# Patient Record
Sex: Female | Born: 1970 | Race: White | Hispanic: No | Marital: Single | Smoking: Former smoker
Health system: Southern US, Community
[De-identification: ages and names within clinical notes are randomized; demographics above are authoritative.]

## PROBLEM LIST (undated history)

## (undated) HISTORY — PX: ROTATOR CUFF REPAIR: SHX139

## (undated) HISTORY — PX: ABDOMINAL SURGERY: SHX537

## (undated) HISTORY — PX: TUMOR REMOVAL: SHX12

---

## 2018-02-02 ENCOUNTER — Encounter (HOSPITAL_COMMUNITY): Payer: Self-pay | Admitting: Emergency Medicine

## 2018-02-02 ENCOUNTER — Emergency Department (HOSPITAL_COMMUNITY)
Admission: EM | Admit: 2018-02-02 | Discharge: 2018-02-03 | Disposition: A | Discharge status: Home / Self Care | Payer: BC Managed Care – PPO | Attending: Emergency Medicine | Admitting: Emergency Medicine

## 2018-02-02 ENCOUNTER — Other Ambulatory Visit: Payer: Self-pay

## 2018-02-02 DIAGNOSIS — R51 Headache: Secondary | ICD-10-CM | POA: Insufficient documentation

## 2018-02-02 DIAGNOSIS — R42 Dizziness and giddiness: Secondary | ICD-10-CM | POA: Diagnosis not present

## 2018-02-02 DIAGNOSIS — H5500 Unspecified nystagmus: Secondary | ICD-10-CM | POA: Insufficient documentation

## 2018-02-02 DIAGNOSIS — Z87891 Personal history of nicotine dependence: Secondary | ICD-10-CM | POA: Insufficient documentation

## 2018-02-02 DIAGNOSIS — R197 Diarrhea, unspecified: Secondary | ICD-10-CM | POA: Insufficient documentation

## 2018-02-02 DIAGNOSIS — R112 Nausea with vomiting, unspecified: Secondary | ICD-10-CM | POA: Diagnosis not present

## 2018-02-02 LAB — URINALYSIS, ROUTINE W REFLEX MICROSCOPIC
Bilirubin Urine: NEGATIVE
Glucose, UA: NEGATIVE mg/dL
Ketones, ur: NEGATIVE mg/dL
Leukocytes, UA: NEGATIVE
Nitrite: NEGATIVE
Protein, ur: NEGATIVE mg/dL
Specific Gravity, Urine: 1.023 (ref 1.005–1.030)
pH: 5 (ref 5.0–8.0)

## 2018-02-02 LAB — CBC
HCT: 42.8 % (ref 36.0–46.0)
HEMOGLOBIN: 14.9 g/dL (ref 12.0–15.0)
MCH: 30.4 pg (ref 26.0–34.0)
MCHC: 34.8 g/dL (ref 30.0–36.0)
MCV: 87.3 fL (ref 80.0–100.0)
Platelets: 239 10*3/uL (ref 150–400)
RBC: 4.9 MIL/uL (ref 3.87–5.11)
RDW: 11.9 % (ref 11.5–15.5)
WBC: 8.9 10*3/uL (ref 4.0–10.5)
nRBC: 0 % (ref 0.0–0.2)

## 2018-02-02 LAB — COMPREHENSIVE METABOLIC PANEL
ALT: 29 U/L (ref 0–44)
AST: 22 U/L (ref 15–41)
Albumin: 3.8 g/dL (ref 3.5–5.0)
Alkaline Phosphatase: 41 U/L (ref 38–126)
Anion gap: 7 (ref 5–15)
BUN: 14 mg/dL (ref 6–20)
CO2: 24 mmol/L (ref 22–32)
Calcium: 9.3 mg/dL (ref 8.9–10.3)
Chloride: 105 mmol/L (ref 98–111)
Creatinine, Ser: 1.04 mg/dL — ABNORMAL HIGH (ref 0.44–1.00)
GFR calc Af Amer: >60 mL/min (ref >60)
GFR calc non Af Amer: >60 mL/min (ref >60)
Glucose, Bld: 108 mg/dL — ABNORMAL HIGH (ref 70–99)
Potassium: 3.5 mmol/L (ref 3.5–5.1)
Sodium: 136 mmol/L (ref 135–145)
TOTAL PROTEIN: 6.8 g/dL (ref 6.5–8.1)
Total Bilirubin: 0.9 mg/dL (ref 0.3–1.2)

## 2018-02-02 LAB — I-STAT BETA HCG BLOOD, ED (MC, WL, AP ONLY): I-stat hCG, quantitative: <5 m[IU]/mL (ref <5)

## 2018-02-02 LAB — LIPASE, BLOOD: Lipase: 33 U/L (ref 11–51)

## 2018-02-02 LAB — TSH: TSH: 2.064 u[IU]/mL (ref 0.350–4.500)

## 2018-02-02 MED ORDER — ONDANSETRON 4 MG PO TBDP
4.0000 mg | ORAL_TABLET | Freq: Once | ORAL | Status: AC | PRN
  Administered 2018-02-02: 4 mg via ORAL
  Filled 2018-02-02: qty 1

## 2018-02-02 MED ORDER — MECLIZINE HCL 25 MG PO TABS
25.0000 mg | ORAL_TABLET | Freq: Once | ORAL | Status: AC
  Administered 2018-02-02: 25 mg via ORAL
  Filled 2018-02-02: qty 1

## 2018-02-02 MED ORDER — DIAZEPAM 5 MG PO TABS
5.0000 mg | ORAL_TABLET | Freq: Once | ORAL | Status: DC
  Filled 2018-02-02: qty 1

## 2018-02-02 MED ORDER — SODIUM CHLORIDE 0.9% FLUSH: 3.0000 mL | Freq: Once | INTRAVENOUS | Status: DC

## 2018-02-02 MED ORDER — SODIUM CHLORIDE 0.9 % IV BOLUS
1000.0000 mL | Freq: Once | INTRAVENOUS | Status: AC
  Administered 2018-02-02: 1000 mL via INTRAVENOUS

## 2018-02-02 MED ORDER — IBUPROFEN 800 MG PO TABS
800.0000 mg | ORAL_TABLET | Freq: Once | ORAL | Status: AC
  Administered 2018-02-02: 800 mg via ORAL
  Filled 2018-02-02: qty 1

## 2018-02-02 MED ORDER — SODIUM CHLORIDE 0.9 % IV BOLUS: 1000.0000 mL | Freq: Once | INTRAVENOUS | Status: DC

## 2018-02-02 NOTE — ED Triage Notes (Addendum)
Pt reports abdominal pain, dizziness, weakness, nausea, diarrhea since Saturday. 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

## 2018-02-02 NOTE — ED Provider Notes (Addendum)
[facility]ENC[facility] Provider Note   CSN: [id number] Arrival date & time: 02/02/18  1703     History   Chief Complaint Chief Complaint  Patient presents with  . Post-op Problem  . Abdominal Pain    HPI Margaret ShinglesJennifer Odonnell is a 48 y.o. female.  Patient who is 6 days status post hysteroscopy fibroidectomy and D&D, presents to the emergency department with a chief complaint of dizziness.  She states that her symptoms started on Wednesday of last week.  She reports having persistent dizziness.  She also reports associated nausea and vomiting.  She is also had a few episodes of diarrhea.  She has been taking Tylenol and ibuprofen for her postop pain with good relief.  He states that she felt dehydrated, and has been drinking a lot of fluid including water and Gatorade.  She is making clear urine.  She denies any changes in her abdominal pain.  States that it is mainly the dizziness and fatigue that brings her to the emergency department tonight.  She states that the dizziness causes her to feel off balance.  It is worsened with head movement.  She has never had vertigo.  She reports moderate headache.  She denies any CP or SOB.  Denies any urinary complaints or new or unusual vaginal discharge or bleeding.  The history is provided by the patient. No language interpreter was used.    History reviewed. No pertinent past medical history.  There are no active problems to display for this patient.   Past Surgical History:  Procedure Laterality Date  . ABDOMINAL SURGERY    . ROTATOR CUFF REPAIR Right   . TUMOR REMOVAL       OB History   No obstetric history on file.      Home Medications    Prior to Admission medications   Not on File    Family History No family history on file.  Social History Social History   Tobacco Use  . Smoking status: Former Smoker    Last attempt to quit: 07/13/2017    Years since quitting: 0.5  . Smokeless tobacco: Never  Used  Substance Use Topics  . Alcohol use: Not Currently  . Drug use: Never     Allergies   Patient has no allergy information on record.   Review of Systems Review of Systems  All other systems reviewed and are negative.    Physical Exam Updated Vital Signs BP 124/66   Pulse 72   Temp 98.8 F (37.1 C) (Oral)   Resp 18   Ht 5\' 7"  (1.702 m)   Wt 99.8 kg   SpO2 99%   BMI 34.46 kg/m   Physical Exam Vitals signs and nursing note reviewed.  Constitutional:      Appearance: She is well-developed.  HENT:     Head: Normocephalic and atraumatic.     Right Ear: External ear normal.     Left Ear: External ear normal.  Eyes:     Conjunctiva/sclera: Conjunctivae normal.     Pupils: Pupils are equal, round, and reactive to light.     Comments: +horizontal nystagmus  Neck:     Musculoskeletal: Normal range of motion and neck supple.     Comments: No pain with neck flexion, no meningismus Cardiovascular:     Rate and Rhythm: Normal rate and regular rhythm.     Heart sounds: Normal heart sounds. No murmur. No friction rub. No gallop.   Pulmonary:  Effort: Pulmonary effort is normal. No respiratory distress.     Breath sounds: Normal breath sounds. No wheezing or rales.  Chest:     Chest wall: No tenderness.  Abdominal:     General: There is no distension.     Palpations: Abdomen is soft. There is no mass.     Tenderness: There is no abdominal tenderness. There is no guarding or rebound.  Musculoskeletal: Normal range of motion.        General: No tenderness.     Comments: Normal gait.  Skin:    General: Skin is warm and dry.  Neurological:     Mental Status: She is alert and oriented to person, place, and time.     Deep Tendon Reflexes: Reflexes are normal and symmetric.     Comments: CN 3-12 intact, normal finger to nose, no pronator drift, sensation and strength intact bilaterally.  Psychiatric:        Behavior: Behavior normal.        Thought Content: Thought  content normal.        Judgment: Judgment normal.      ED Treatments / Results  Labs (all labs ordered are listed, but only abnormal results are displayed) Labs Reviewed  COMPREHENSIVE METABOLIC PANEL - Abnormal; Notable for the following components:      Result Value   Glucose, Bld 108 (*)    Creatinine, Ser 1.04 (*)    All other components within normal limits  URINALYSIS, ROUTINE W REFLEX MICROSCOPIC - Abnormal; Notable for the following components:   Hgb urine dipstick SMALL (*)    Bacteria, UA FEW (*)    All other components within normal limits  LIPASE, BLOOD  CBC  TSH  I-STAT BETA HCG BLOOD, ED ([facility], [facility], [facility] ONLY)    EKG None  Radiology No results found.  Procedures Procedures (including critical care time)  Medications Ordered in ED Medications  meclizine (ANTIVERT) tablet 25 mg (has no administration in time range)  sodium chloride 0.9 % bolus 1,000 mL (has no administration in time range)  ibuprofen (ADVIL,MOTRIN) tablet 800 mg (has no administration in time range)  ondansetron (ZOFRAN-ODT) disintegrating tablet 4 mg (4 mg Oral Given 02/02/18 1812)     Initial Impression / Assessment and Plan / ED Course  I have reviewed the triage vital signs and the nursing notes.  Pertinent labs & imaging results that were available during my care of the patient were reviewed by me and considered in my medical decision making (see chart for details).    Patient with dizziness x 6 days.  She has horizontal nystagmus and her symptoms worsen with head movement.  She ambulates appropriately and has not ataxia.  I do not suspect central source. Her labs are reassuring.  No evidence of anemia or electrolyte disturbances.  She is mildly improved after meclizine.  Offered valium, but patient states she will take this at home since she is driving.  Encouraged PCP follow-up.  Final Clinical Impressions(s) / ED Diagnoses   Final diagnoses:  Vertigo    ED Discharge Orders          Ordered    diazepam (VALIUM) 5 MG tablet  Every 8 hours PRN     02/03/18 0029               Roxy Horseman, PA-C 02/03/18 0040    Terrilee Files, MD 02/03/18 1321

## 2018-02-03 MED ORDER — DIAZEPAM 5 MG PO TABS: 5.0000 mg | ORAL_TABLET | Freq: Three times a day (TID) | ORAL | 0 refills | Status: AC | PRN

## 2018-02-03 NOTE — Discharge Instructions (Signed)
Please follow-up with your doctor.  Return to the ER if your symptoms worsen.  Take medications as directed.

## 2018-02-03 NOTE — ED Notes (Signed)
Patient verbalizes understanding of medications and discharge instructions. No further questions at this time. VSS and patient ambulatory at discharge.   

## 2018-02-03 NOTE — ED Notes (Signed)
Patient refused medication at this time, states "I would like to go home and I can't drive if I take that".   MD made aware.

## 2020-02-14 ENCOUNTER — Encounter: Payer: Self-pay | Admitting: Emergency Medicine

## 2020-02-20 ENCOUNTER — Encounter: Payer: Self-pay | Admitting: Emergency Medicine

## 2020-02-20 ENCOUNTER — Ambulatory Visit: Payer: Self-pay | Admitting: Emergency Medicine

## 2020-02-20 ENCOUNTER — Other Ambulatory Visit: Payer: Self-pay

## 2020-02-20 ENCOUNTER — Ambulatory Visit (INDEPENDENT_AMBULATORY_CARE_PROVIDER_SITE_OTHER): Payer: Self-pay

## 2020-02-20 VITALS — BP 116/84 | HR 82 | Ht 67.0 in | Wt 211.0 lb

## 2020-02-20 DIAGNOSIS — R0602 Shortness of breath: Secondary | ICD-10-CM

## 2020-02-20 DIAGNOSIS — R0600 Dyspnea, unspecified: Secondary | ICD-10-CM | POA: Insufficient documentation

## 2020-02-20 NOTE — Patient Instructions (Addendum)
Stop Flovent Okay to keep albuterol available to use 2 puffs if needed shortness breath, chest tightness, wheezing. We will arrange for pulmonary function testing in next office visit Chest x-ray today Please continue your nasal spray, 2 sprays each nostril twice a day. Please continue your pantoprazole (Protonix) as you have been taking it. Follow with Dr. Delton Coombes next available with full pulmonary function testing on the same day.

## 2020-02-20 NOTE — Assessment & Plan Note (Signed)
Has developed in the aftermath of COVID-19 in January.  Suspect that she is still feeling the effects of the viral infection that include fatigue, etc.  Need to consider any post COVID changes including possible asthma, possible interstitial disease.  We will arrange for pulmonary function testing and a chest x-ray.  I think we should stop Flovent but she can keep albuterol available until we have determined her degree of obstruction.  Need to also consider possible restriction in her respiratory muscle weakness given her history of Guillain-Barr.  She is not having any neurological symptoms right now which makes it unlikely.

## 2020-02-20 NOTE — Progress Notes (Signed)
Subjective:    Patient ID: Margaret Odonnell, female    DOB: 1970/11/17, 50 y.o.   MRN: [id number]  HPI 50 year old intermittent smoker (10 pack years) with a history of GERD, Guillain-Barr, renal calculi, fibroids post hysterectomy, menorrhagia.   She presents today for evaluation of dyspnea and fatigue.  She was diagnosed with COVID-19 01/20/2020. Developed viral sx, body aches 1/1 after a known exposure. Lost her taste and smell. After about 5 days she began to have SOB. Finally tested positive on 1/7. Her dyspnea improved but then worsened again.  She has kept a low level cough. Her SOB . She was given albuterol, added Flovent. Then treated with pred after a week for dyspnea and throat edema. Also treated with abx. She did not get monoclonal Ab. She continues to have a dry cough, feels weak. Continues to have some exertional SOB. She is no longer taking flovent on a schedule. Not requiring any albuterol now.      Review of Systems As per HPI  PMH: GERD, Gilbert's syndrome Renal stones Fibroids status post hysterectomy Menorrhagia  No family history on file.   Social History   Socioeconomic History  . Marital status: Single    Spouse name: Not on file  . Number of children: Not on file  . Years of education: Not on file  . Highest education level: Not on file  Occupational History  . Not on file  Tobacco Use  . Smoking status: Former Smoker    Types: Cigarettes    Quit date: 07/13/2017    Years since quitting: 2.6  . Smokeless tobacco: Never Used  . Tobacco comment: last smoked 01/19/2019, 1/2 pack a day  Substance and Sexual Activity  . Alcohol use: Not Currently  . Drug use: Never  . Sexual activity: Not on file  Other Topics Concern  . Not on file  Social History Narrative  . Not on file   Social Determinants of Health   Financial Resource Strain: Not on file  Food Insecurity: Not on file  Transportation Needs: Not on file  Physical Activity: Not on file   Stress: Not on file  Social Connections: Not on file  Intimate Partner Violence: Not on file    [place] in [place] No other exposure Has a dog, never birds No hot tub or mold.   No Known Allergies   Outpatient Medications Prior to Visit  Medication Sig Dispense Refill  . buPROPion (WELLBUTRIN XL) 150 MG 24 hr tablet Take 150 mg by mouth daily.    Marland Kitchen FLOVENT HFA 110 MCG/ACT inhaler Inhale 2 puffs into the lungs 2 (two) times daily.    Marland Kitchen ibuprofen (ADVIL,MOTRIN) 600 MG tablet Take 600 mg by mouth every 6 (six) hours as needed for moderate pain.    . Multiple Vitamin (MULTIVITAMIN) capsule Take 1 capsule by mouth daily.    Marland Kitchen NASAL ALLERGY 24 HOUR 55 MCG/ACT AERO nasal inhaler 2 sprays daily.    . pantoprazole (PROTONIX) 40 MG tablet Take 40 mg by mouth daily.    . vitamin C (ASCORBIC ACID) 500 MG tablet Take 500 mg by mouth daily.    Marland Kitchen zinc gluconate 50 MG tablet Take 50 mg by mouth daily.    . diazepam (VALIUM) 5 MG tablet Take 1 tablet (5 mg total) by mouth every 8 (eight) hours as needed for anxiety. (Patient not taking: Reported on 02/20/2020) 5 tablet 0   No facility-administered medications prior to visit.  Objective:   Physical Exam Vitals:   02/20/20 1617  BP: 116/84  Pulse: 82  SpO2: 98%  Weight: 211 lb (95.7 kg)  Height: 5\' 7"  (1.702 m)   Gen: Pleasant, overwt woman, in no distress,  normal affect  ENT: No lesions,  mouth clear,  oropharynx clear, no postnasal drip  Neck: No JVD, no stridor  Lungs: No use of accessory muscles, no crackles or wheezing on normal respiration, no wheeze on forced expiration  Cardiovascular: RRR, heart sounds normal, no murmur or gallops, no peripheral edema  Musculoskeletal: No deformities, no cyanosis or clubbing  Neuro: alert, awake, non focal  Skin: Warm, no lesions or rash      Assessment & Plan:  Dyspnea Has developed in the aftermath of COVID-19 in January.  Suspect that she is  still feeling the effects of the viral infection that include fatigue, etc.  Need to consider any post COVID changes including possible asthma, possible interstitial disease.  We will arrange for pulmonary function testing and a chest x-ray.  I think we should stop Flovent but she can keep albuterol available until we have determined her degree of obstruction.  Need to also consider possible restriction in her respiratory muscle weakness given her history of Guillain-Barr.  She is not having any neurological symptoms right now which makes it unlikely.  February, MD, PhD 02/20/2020, 4:53 PM [facility] [phone] or if no answer before 7:00PM call [phone] For any issues after 7:00PM please call eLink [phone]

## 2022-09-20 IMAGING — DX DG CHEST 2V
2 series · 2 of 2 positions shown · non-contrast
Comparison: No pertinent prior exams available for comparison.

CLINICAL DATA: Shortness of breath.

EXAM:
CHEST - 2 VIEW

[chest pa]
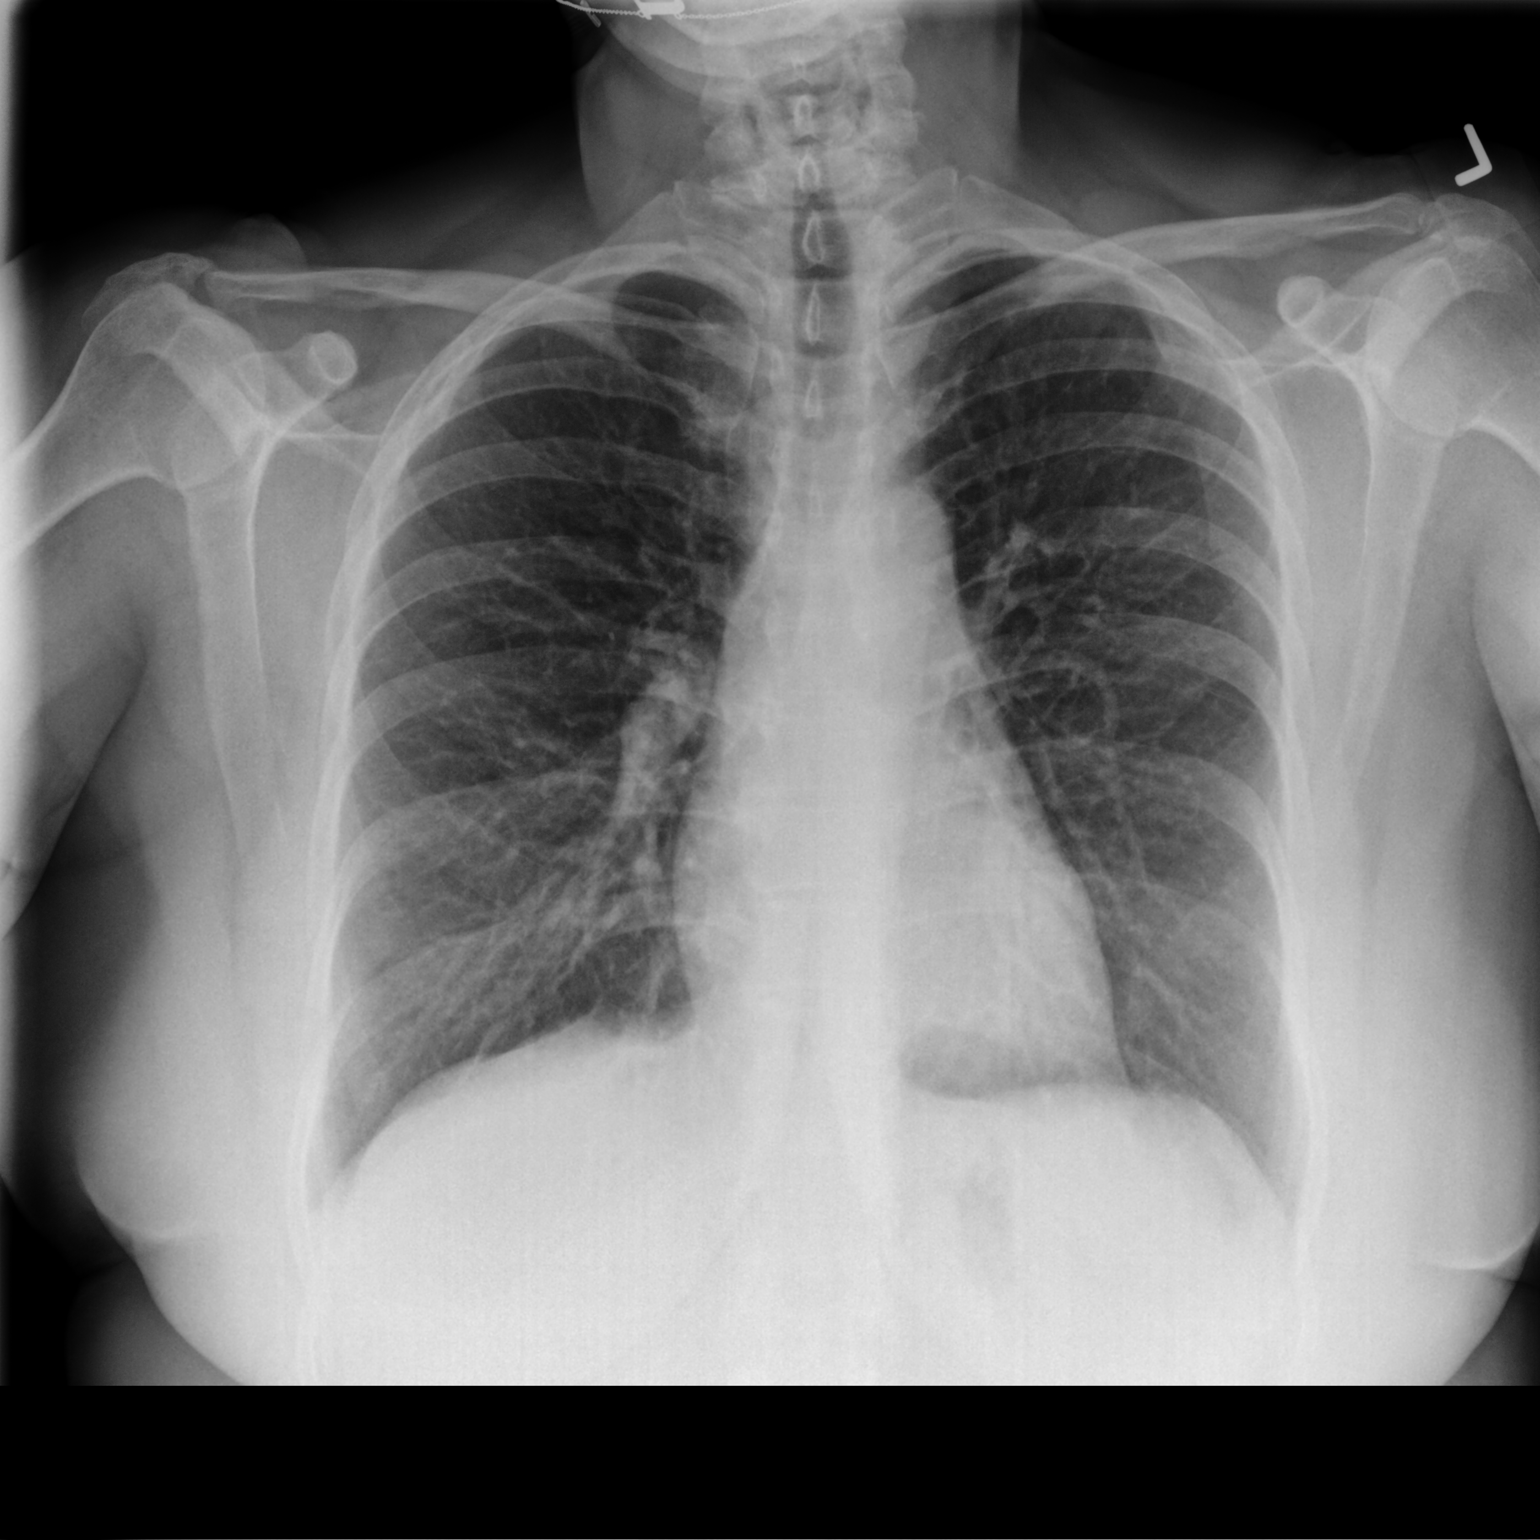

[chest lat]
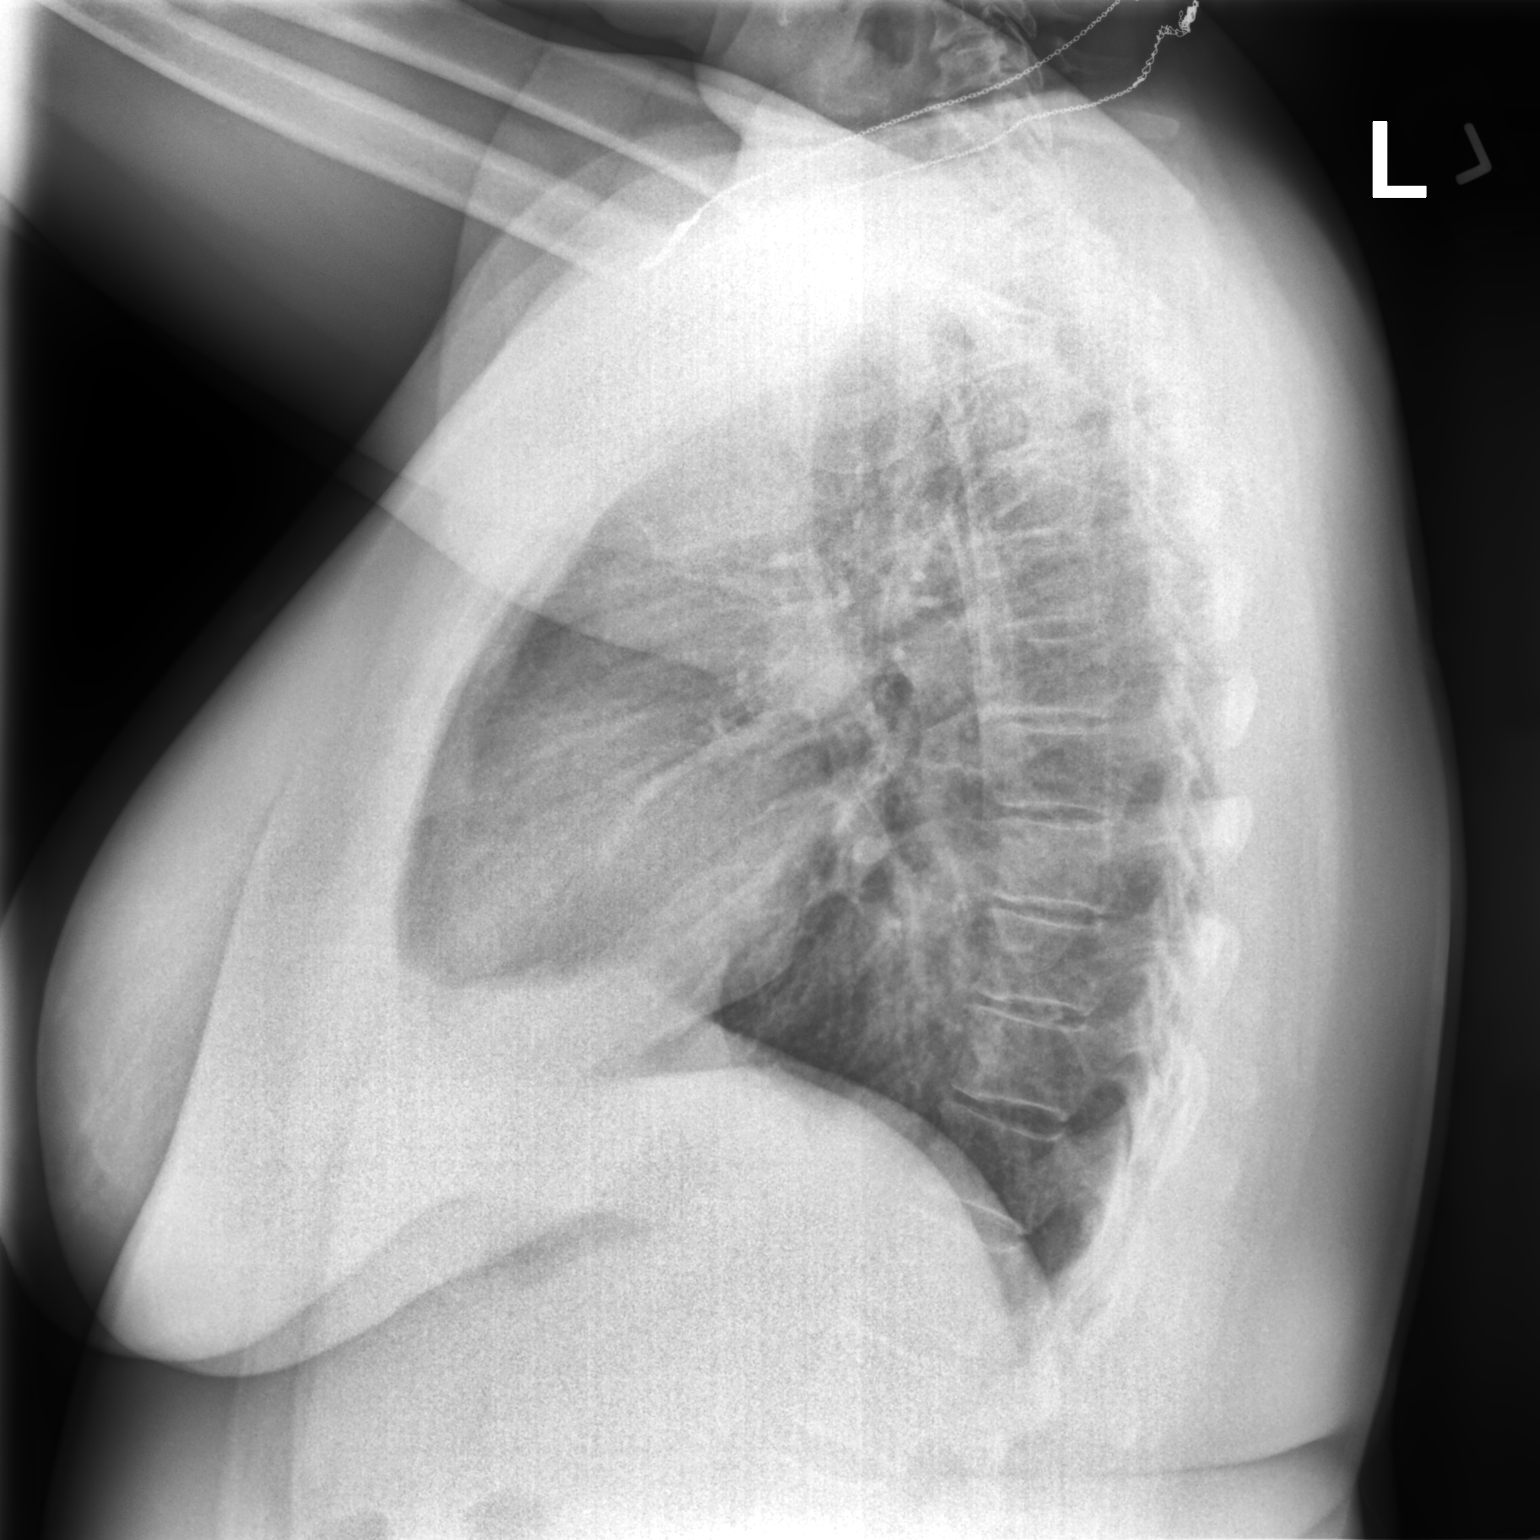

[2 of 2 positions shown; findings below may reference images not displayed]

FINDINGS: Heart size within normal limits.

No appreciable airspace consolidation.No evidence of pleural
effusion or pneumothorax.

No acute bony abnormality identified.
IMPRESSION: No evidence of active cardiopulmonary disease.
